# Patient Record
Sex: Female | Born: 1990 | Hispanic: No | Marital: Single | Smoking: Current every day smoker
Health system: Southern US, Community
[De-identification: ages and names within clinical notes are randomized; demographics above are authoritative.]

## PROBLEM LIST (undated history)

## (undated) HISTORY — PX: TONSILLECTOMY: SUR1361

## (undated) HISTORY — PX: SKIN GRAFT: SHX250

---

## 2009-07-27 LAB — HM PAP SMEAR: HM Pap smear: NORMAL

## 2011-08-14 ENCOUNTER — Ambulatory Visit (INDEPENDENT_AMBULATORY_CARE_PROVIDER_SITE_OTHER): Payer: 59 | Admitting: Internal Medicine

## 2011-08-14 ENCOUNTER — Encounter: Payer: Self-pay | Admitting: Internal Medicine

## 2011-08-14 VITALS — BP 130/72 | HR 92 | Temp 98.0°F | Ht 62.0 in | Wt 127.0 lb

## 2011-08-14 DIAGNOSIS — Z720 Tobacco use: Secondary | ICD-10-CM | POA: Insufficient documentation

## 2011-08-14 DIAGNOSIS — F17200 Nicotine dependence, unspecified, uncomplicated: Secondary | ICD-10-CM
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
## 2011-08-14 NOTE — Assessment & Plan Note (Signed)
Encouraged smoking cessation. Discussed potential mechanisms to help her quit smoking including the use of Wellbutrin or Chantix. Also discussed nicotine replacement. The patient is not overweight he quit smoking. We will revisit this at her physical exam in one month.

## 2011-09-11 ENCOUNTER — Other Ambulatory Visit (INDEPENDENT_AMBULATORY_CARE_PROVIDER_SITE_OTHER): Payer: 59 | Admitting: *Deleted

## 2011-09-11 DIAGNOSIS — Z0000 Encounter for general adult medical examination without abnormal findings: Secondary | ICD-10-CM

## 2011-09-11 LAB — CBC WITH DIFFERENTIAL/PLATELET
Basophils Absolute: 0 10*3/uL (ref 0.0–0.1)
Eosinophils Relative: 3.3 % (ref 0.0–5.0)
HCT: 37.9 % (ref 36.0–46.0)
Lymphs Abs: 3.4 10*3/uL (ref 0.7–4.0)
Monocytes Relative: 7.7 % (ref 3.0–12.0)
Neutrophils Relative %: 50.3 % (ref 43.0–77.0)
Platelets: 320 10*3/uL (ref 150.0–400.0)
RDW: 13.7 % (ref 11.5–14.6)
WBC: 8.9 10*3/uL (ref 4.5–10.5)

## 2011-09-11 LAB — COMPREHENSIVE METABOLIC PANEL
ALT: 17 U/L (ref 0–35)
Albumin: 4.2 g/dL (ref 3.5–5.2)
Alkaline Phosphatase: 49 U/L (ref 39–117)
CO2: 27 mEq/L (ref 19–32)
GFR: 141.88 mL/min (ref >60.00)
Potassium: 4.2 mEq/L (ref 3.5–5.1)
Sodium: 141 mEq/L (ref 135–145)
Total Bilirubin: 0.2 mg/dL — ABNORMAL LOW (ref 0.3–1.2)
Total Protein: 6.9 g/dL (ref 6.0–8.3)

## 2011-09-11 LAB — LIPID PANEL
Total CHOL/HDL Ratio: 3
VLDL: 15.4 mg/dL (ref 0.0–40.0)

## 2011-09-25 ENCOUNTER — Encounter: Payer: 59 | Admitting: Internal Medicine

## 2011-09-25 DIAGNOSIS — Z0289 Encounter for other administrative examinations: Secondary | ICD-10-CM

## 2011-10-23 ENCOUNTER — Encounter: Payer: 59 | Admitting: Internal Medicine

## 2011-10-27 ENCOUNTER — Encounter: Payer: 59 | Admitting: Internal Medicine

## 2011-10-29 ENCOUNTER — Other Ambulatory Visit (HOSPITAL_COMMUNITY)
Admission: RE | Admit: 2011-10-29 | Discharge: 2011-10-29 | Disposition: A | Discharge status: Home / Self Care | Payer: 59 | Source: Ambulatory Visit | Attending: Internal Medicine | Admitting: Internal Medicine

## 2011-10-29 ENCOUNTER — Ambulatory Visit (INDEPENDENT_AMBULATORY_CARE_PROVIDER_SITE_OTHER): Payer: 59 | Admitting: Internal Medicine

## 2011-10-29 ENCOUNTER — Encounter: Payer: Self-pay | Admitting: Internal Medicine

## 2011-10-29 VITALS — BP 118/70 | HR 96 | Temp 99.0°F | Ht 62.0 in | Wt 135.0 lb

## 2011-10-29 DIAGNOSIS — Z01419 Encounter for gynecological examination (general) (routine) without abnormal findings: Secondary | ICD-10-CM | POA: Insufficient documentation

## 2011-10-29 DIAGNOSIS — Z1159 Encounter for screening for other viral diseases: Secondary | ICD-10-CM | POA: Insufficient documentation

## 2011-10-29 DIAGNOSIS — Z124 Encounter for screening for malignant neoplasm of cervix: Secondary | ICD-10-CM

## 2011-10-29 NOTE — Progress Notes (Addendum)
Subjective:    Patient ID: Tricia Anderson, female    DOB: 1991/04/19, 21 y.o.   MRN: [id number]  HPI  20YO female presents for annual exam.No concerns today. Has light spotting associated with menses presently. Reports normal energy level, normal appetite.  No outpatient encounter prescriptions on file as of 10/29/2011.     Review of Systems  Constitutional: Negative for fever, chills, appetite change, fatigue and unexpected weight change.  HENT: Negative for ear pain, congestion, sore throat, trouble swallowing, neck pain, voice change and sinus pressure.   Eyes: Negative for visual disturbance.  Respiratory: Negative for cough, shortness of breath, wheezing and stridor.   Cardiovascular: Negative for chest pain, palpitations and leg swelling.  Gastrointestinal: Negative for nausea, vomiting, abdominal pain, diarrhea, constipation, blood in stool, abdominal distention and anal bleeding.  Genitourinary: Positive for vaginal bleeding (start of menses). Negative for dysuria and flank pain.  Musculoskeletal: Negative for myalgias, arthralgias and gait problem.  Skin: Negative for color change and rash.  Neurological: Negative for dizziness and headaches.  Hematological: Negative for adenopathy. Does not bruise/bleed easily.  Psychiatric/Behavioral: Negative for suicidal ideas, sleep disturbance and dysphoric mood. The patient is not nervous/anxious.    BP 118/70  Pulse 96  Temp(Src) 99 F (37.2 C) (Oral)  Ht 5\' 2"  (1.575 m)  Wt 135 lb (61.236 kg)  BMI 24.69 kg/m2  SpO2 99%  LMP 10/29/2011     Objective:   Physical Exam  Constitutional: She is oriented to person, place, and time. She appears well-developed and well-nourished. No distress.  HENT:  Head: Normocephalic and atraumatic.  Right Ear: External ear normal.  Left Ear: External ear normal.  Nose: Nose normal.  Mouth/Throat: Oropharynx is clear and moist. No oropharyngeal exudate.  Eyes: Conjunctivae are normal. Pupils  are equal, round, and reactive to light. Right eye exhibits no discharge. Left eye exhibits no discharge. No scleral icterus.  Neck: Normal range of motion. Neck supple. No tracheal deviation present. No thyromegaly present.  Cardiovascular: Normal rate, regular rhythm, normal heart sounds and intact distal pulses.  Exam reveals no gallop and no friction rub.   No murmur heard. Pulmonary/Chest: Effort normal and breath sounds normal. No respiratory distress. She has no wheezes. She has no rales. She exhibits no tenderness.  Abdominal: Soft. Bowel sounds are normal. She exhibits no distension and no mass. There is no tenderness. There is no rebound and no guarding.  Genitourinary: Rectum normal and uterus normal. No breast swelling, tenderness, discharge or bleeding. Pelvic exam was performed with patient supine. There is no rash, tenderness or lesion on the right labia. There is no rash, tenderness or lesion on the left labia. Uterus is not enlarged and not tender. Cervix exhibits no motion tenderness, no discharge and no friability. Right adnexum displays no mass, no tenderness and no fullness. Left adnexum displays no mass, no tenderness and no fullness. There is bleeding around the vagina. No erythema or tenderness around the vagina. No vaginal discharge found.  Musculoskeletal: Normal range of motion. She exhibits no edema and no tenderness.  Lymphadenopathy:    She has no cervical adenopathy.  Neurological: She is alert and oriented to person, place, and time. No cranial nerve deficit. She exhibits normal muscle tone. Coordination normal.  Skin: Skin is warm and dry. No rash noted. She is not diaphoretic. No erythema. No pallor.  Psychiatric: She has a normal mood and affect. Her behavior is normal. Judgment and thought content normal.  Assessment & Plan:

## 2011-10-29 NOTE — Assessment & Plan Note (Signed)
General exam including pelvic exam is normal today. Pap is pending. Other health maintenance including lab work is up-to-date. Will get records on recent Tdap vaccine. Follow up 1 year and prn.

## 2011-10-29 NOTE — Patient Instructions (Signed)
Please ask your step mother if she knows the date of your last TDAP (tetanus with pertussis vaccine)

## 2011-11-05 ENCOUNTER — Other Ambulatory Visit: Payer: Self-pay | Admitting: *Deleted

## 2011-11-05 MED ORDER — METRONIDAZOLE 250 MG PO TABS: 250.0000 mg | ORAL_TABLET | Freq: Once | ORAL | Status: AC

## 2013-04-17 ENCOUNTER — Emergency Department: Payer: Self-pay | Admitting: Emergency Medicine

## 2018-08-09 ENCOUNTER — Emergency Department
Admission: EM | Admit: 2018-08-09 | Discharge: 2018-08-09 | Disposition: A | Discharge status: Home / Self Care | Payer: 59 | Attending: Emergency Medicine | Admitting: Emergency Medicine

## 2018-08-09 ENCOUNTER — Other Ambulatory Visit: Payer: Self-pay

## 2018-08-09 ENCOUNTER — Encounter: Payer: Self-pay | Admitting: Emergency Medicine

## 2018-08-09 DIAGNOSIS — Y939 Activity, unspecified: Secondary | ICD-10-CM | POA: Insufficient documentation

## 2018-08-09 DIAGNOSIS — Y998 Other external cause status: Secondary | ICD-10-CM | POA: Insufficient documentation

## 2018-08-09 DIAGNOSIS — Y929 Unspecified place or not applicable: Secondary | ICD-10-CM | POA: Insufficient documentation

## 2018-08-09 DIAGNOSIS — S025XXB Fracture of tooth (traumatic), initial encounter for open fracture: Secondary | ICD-10-CM

## 2018-08-09 DIAGNOSIS — S025XXA Fracture of tooth (traumatic), initial encounter for closed fracture: Secondary | ICD-10-CM | POA: Insufficient documentation

## 2018-08-09 DIAGNOSIS — X58XXXA Exposure to other specified factors, initial encounter: Secondary | ICD-10-CM | POA: Insufficient documentation

## 2018-08-09 DIAGNOSIS — F17210 Nicotine dependence, cigarettes, uncomplicated: Secondary | ICD-10-CM | POA: Insufficient documentation

## 2018-08-09 MED ORDER — TRAMADOL HCL 50 MG PO TABS: 50.0000 mg | ORAL_TABLET | Freq: Four times a day (QID) | ORAL | 0 refills | Status: AC | PRN

## 2018-08-09 MED ORDER — IBUPROFEN 800 MG PO TABS: 800.0000 mg | ORAL_TABLET | Freq: Three times a day (TID) | ORAL | 0 refills | Status: DC | PRN

## 2018-08-09 MED ORDER — OXYCODONE-ACETAMINOPHEN 5-325 MG PO TABS
1.0000 | ORAL_TABLET | Freq: Once | ORAL | Status: AC
  Administered 2018-08-09: 1 via ORAL
  Filled 2018-08-09: qty 1

## 2018-08-09 MED ORDER — AMOXICILLIN 500 MG PO CAPS: 500.0000 mg | ORAL_CAPSULE | Freq: Three times a day (TID) | ORAL | 0 refills | Status: DC

## 2018-08-09 MED ORDER — LIDOCAINE VISCOUS HCL 2 % MT SOLN: 10.0000 mL | OROMUCOSAL | 0 refills | Status: DC | PRN

## 2018-08-09 MED ORDER — LIDOCAINE VISCOUS HCL 2 % MT SOLN
15.0000 mL | Freq: Once | OROMUCOSAL | Status: AC
  Administered 2018-08-09: 15 mL via OROMUCOSAL
  Filled 2018-08-09: qty 15

## 2018-08-09 NOTE — ED Triage Notes (Signed)
Pt c/o dental pain, has broken tooth but unable to follow up with dentist. NAD ntoed

## 2018-08-09 NOTE — ED Notes (Signed)
See triage note  Presents with dental pain  States  She thinks she broke a tooth

## 2018-08-09 NOTE — ED Provider Notes (Signed)
[facility] Emergency Department Provider Note  ____________________________________________  Time seen: Approximately 1:34 PM  I have reviewed the triage vital signs and the nursing notes.   HISTORY  Chief Complaint Dental Pain    HPI Tricia Anderson is a 28 y.o. female presents emergency department for evaluation of right top tooth pain for 2 days.  Patient states that her tooth cracked on Sunday.  She had to miss work due to pain.  She states that she was unable to get out of bed due to the pain to come to the emergency department until today.  She has not been able to see a dentist because she does not have the money.  No fever, chills.   History reviewed. No pertinent past medical history.  Patient Active Problem List   Diagnosis Date Noted  . Screening for cervical cancer 10/29/2011  . Tobacco abuse 08/14/2011    Past Surgical History:  Procedure Laterality Date  . SKIN GRAFT     In mouth  . TONSILLECTOMY      Prior to Admission medications   Medication Sig Start Date End Date Taking? Authorizing Provider  amoxicillin (AMOXIL) 500 MG capsule Take 1 capsule (500 mg total) by mouth 3 (three) times daily. 08/09/18   Enid DerryWagner, Zyair Rhein, PA-C  ibuprofen (ADVIL,MOTRIN) 800 MG tablet Take 1 tablet (800 mg total) by mouth every 8 (eight) hours as needed. 08/09/18   Enid DerryWagner, Chonte Ricke, PA-C  lidocaine (XYLOCAINE) 2 % solution Use as directed 10 mLs in the mouth or throat as needed for mouth pain. 08/09/18   Enid DerryWagner, Maxwel Meadowcroft, PA-C  traMADol (ULTRAM) 50 MG tablet Take 1 tablet (50 mg total) by mouth every 6 (six) hours as needed. 08/09/18 08/09/19  Enid DerryWagner, Lithzy Bernard, PA-C    Allergies Patient has no known allergies.  Family History  Problem Relation Age of Onset  . Hypertension Mother   . Colon cancer Father [id number]  . Cancer Maternal Grandfather   . Diabetes Paternal Grandmother   . Cancer Paternal Grandfather   . Breast cancer Neg Hx     Social History Social  History   Tobacco Use  . Smoking status: Current Every Day Smoker    Packs/day: 1.00  . Smokeless tobacco: Never Used  Substance Use Topics  . Alcohol use: No  . Drug use: No     Review of Systems  Constitutional: No fever/chills Cardiovascular: No chest pain. Respiratory:  No SOB. Gastrointestinal: No abdominal pain.  No nausea, no vomiting.  Musculoskeletal: Negative for musculoskeletal pain. Skin: Negative for rash, abrasions, lacerations, ecchymosis. Neurological: Negative for headaches, numbness or tingling   ____________________________________________   PHYSICAL EXAM:  VITAL SIGNS: ED Triage Vitals  Enc Vitals Group     BP 08/09/18 1216 (!) 141/83     Pulse Rate 08/09/18 1216 90     Resp 08/09/18 1216 16     Temp 08/09/18 1216 98.5 F (36.9 C)     Temp Source 08/09/18 1216 Oral     SpO2 08/09/18 1216 100 %     Weight --      Height --      Head Circumference --      Peak Flow --      Pain Score 08/09/18 1209 7     Pain Loc --      Pain Edu? --      Excl. in [place]? --      Constitutional: Alert and oriented. Well appearing and in no acute distress. Eyes:  Conjunctivae are normal. PERRL. EOMI. Head: Atraumatic. ENT:      Ears:      Nose: No congestion/rhinnorhea.      Mouth/Throat: Mucous membranes are moist.  Large fracture and cavity to right back top molar.  No visible swelling.  No difficulty opening or closing mouth.  No drainage. Neck: No stridor.  Cardiovascular: Normal rate, regular rhythm.  Good peripheral circulation. Respiratory: Normal respiratory effort without tachypnea or retractions. Lungs CTAB. Good air entry to the bases with no decreased or absent breath sounds. Musculoskeletal: Full range of motion to all extremities. No gross deformities appreciated. Neurologic:  Normal speech and language. No gross focal neurologic deficits are appreciated.  Skin:  Skin is warm, dry and intact. No rash noted. Psychiatric: Mood and affect are normal.  Speech and behavior are normal. Patient exhibits appropriate insight and judgement.   ____________________________________________   LABS (all labs ordered are listed, but only abnormal results are displayed)  Labs Reviewed - No data to display ____________________________________________  EKG   ____________________________________________  RADIOLOGY  No results found.  ____________________________________________    PROCEDURES  Procedure(s) performed:    Procedures    Medications  oxyCODONE-acetaminophen (PERCOCET/ROXICET) 5-325 MG per tablet 1 tablet (1 tablet Oral Given 08/09/18 1350)  lidocaine (XYLOCAINE) 2 % viscous mouth solution 15 mL (15 mLs Mouth/Throat Given 08/09/18 1350)     ____________________________________________   INITIAL IMPRESSION / ASSESSMENT AND PLAN / ED COURSE  Pertinent labs & imaging results that were available during my care of the patient were reviewed by me and considered in my medical decision making (see chart for details).  Review of the [place] CSRS was performed in accordance of the [facility] prior to dispensing any controlled drugs.     Patient's diagnosis is consistent with cracked tooth. Patient will be discharged home with prescriptions for amoxicillin, tramadol, viscous lidocaine. Patient is to follow up with dentist as directed.  Dental resources were provided.  Patient is given ED precautions to return to the ED for any worsening or new symptoms.     ____________________________________________  FINAL CLINICAL IMPRESSION(S) / ED DIAGNOSES  Final diagnoses:  Open fracture of tooth, initial encounter      NEW MEDICATIONS STARTED DURING THIS VISIT:  ED Discharge Orders         Ordered    amoxicillin (AMOXIL) 500 MG capsule  3 times daily     08/09/18 1339    traMADol (ULTRAM) 50 MG tablet  Every 6 hours PRN     08/09/18 1339    lidocaine (XYLOCAINE) 2 % solution  As needed     08/09/18 1339    ibuprofen  (ADVIL,MOTRIN) 800 MG tablet  Every 8 hours PRN     08/09/18 1339              This chart was dictated using voice recognition software/Dragon. Despite best efforts to proofread, errors can occur which can change the meaning. Any change was purely unintentional.    Enid DerryWagner, Barnett Elzey, PA-C 08/09/18 1412    Minna AntisPaduchowski, Kevin, MD 08/09/18 [phone]

## 2019-11-06 ENCOUNTER — Ambulatory Visit: Payer: Self-pay | Admitting: Obstetrics and Gynecology

## 2019-11-06 NOTE — Progress Notes (Signed)
     Chief Complaint  Patient presents with  . Nexplanon removal    not interested in [facility]    History of Present Illness:  Tricia Anderson is a 29 y.o. that had an IMplanon placed approximately in 2006. Pt lost insurance and hasn't had any until now to have it removed. Has monthly menses, lasting 4-7 days, no BTB, mild dysmen. She is sex actie with female partner. Declines other BC.  No recent pap. No hx of abn paps/STDs. Declines annual today and wants to RTO for annual. ago.  BP 120/80   Ht 5\' 2"  (1.575 m)   Wt 142 lb (64.4 kg)   BMI 25.97 kg/m    Nexplanon removal Procedure note - The Nexplanon was noted in the patient's arm and the end was identified. The skin was cleansed with a Betadine solution. A small injection of subcutaneous lidocaine with epinephrine was given over the end of the implant. An incision was made at the end of the implant. The rod was noted in the incision and grasped with a hemostat. It was noted to be intact.  Steri-Strip was placed approximating the incision. Hemostasis was noted.  Assessment: Nexplanon removal   Plan:   She was told to remove the dressing in 12-24 hours, to keep the incision area dry for 24 hours and to remove the Steristrip in 2-3  days.  Notify 07-11-1992 if any signs of tenderness, redness, pain, or fevers develop.  RTO in 1 mo for annual.   Deborahann Poteat B. Samer Dutton, PA-C 11/07/2019 4:00 PM

## 2019-11-07 ENCOUNTER — Ambulatory Visit (INDEPENDENT_AMBULATORY_CARE_PROVIDER_SITE_OTHER): Payer: Medicaid Other | Admitting: Obstetrics and Gynecology

## 2019-11-07 ENCOUNTER — Encounter: Payer: Self-pay | Admitting: Obstetrics and Gynecology

## 2019-11-07 ENCOUNTER — Other Ambulatory Visit: Payer: Self-pay

## 2019-11-07 VITALS — BP 120/80 | Ht 62.0 in | Wt 142.0 lb

## 2019-11-07 DIAGNOSIS — Z3046 Encounter for surveillance of implantable subdermal contraceptive: Secondary | ICD-10-CM

## 2019-11-07 NOTE — Patient Instructions (Signed)
I value your feedback and entrusting us with your care. If you get a [facility] patient survey, I would appreciate you taking the time to let us know about your experience today. Thank you!  As of July 06, 2019, your lab results will be released to your MyChart immediately, before I even have a chance to see them. Please give me time to review them and contact you if there are any abnormalities. Thank you for your patience.   Remove the dressing in 24 hours,  keep the incision area dry for 24 hours and remove the Steristrip in 2-3  days.  Notify us if any signs of tenderness, redness, pain, or fevers develop.  

## 2021-07-16 DIAGNOSIS — F411 Generalized anxiety disorder: Secondary | ICD-10-CM | POA: Diagnosis not present

## 2021-07-16 DIAGNOSIS — F3341 Major depressive disorder, recurrent, in partial remission: Secondary | ICD-10-CM | POA: Diagnosis not present

## 2021-08-19 DIAGNOSIS — F3341 Major depressive disorder, recurrent, in partial remission: Secondary | ICD-10-CM | POA: Diagnosis not present

## 2021-08-19 DIAGNOSIS — F411 Generalized anxiety disorder: Secondary | ICD-10-CM | POA: Diagnosis not present

## 2021-10-11 ENCOUNTER — Other Ambulatory Visit: Payer: Self-pay

## 2021-10-11 ENCOUNTER — Emergency Department
Admission: EM | Admit: 2021-10-11 | Discharge: 2021-10-11 | Disposition: A | Discharge status: Home / Self Care | Payer: BC Managed Care – PPO | Attending: Emergency Medicine | Admitting: Emergency Medicine

## 2021-10-11 ENCOUNTER — Emergency Department: Payer: BC Managed Care – PPO

## 2021-10-11 DIAGNOSIS — Z23 Encounter for immunization: Secondary | ICD-10-CM | POA: Insufficient documentation

## 2021-10-11 DIAGNOSIS — Y92000 Kitchen of unspecified non-institutional (private) residence as  the place of occurrence of the external cause: Secondary | ICD-10-CM | POA: Diagnosis not present

## 2021-10-11 DIAGNOSIS — Y93G1 Activity, food preparation and clean up: Secondary | ICD-10-CM | POA: Insufficient documentation

## 2021-10-11 DIAGNOSIS — W260XXA Contact with knife, initial encounter: Secondary | ICD-10-CM | POA: Insufficient documentation

## 2021-10-11 DIAGNOSIS — M79642 Pain in left hand: Secondary | ICD-10-CM

## 2021-10-11 DIAGNOSIS — S61412A Laceration without foreign body of left hand, initial encounter: Secondary | ICD-10-CM | POA: Diagnosis not present

## 2021-10-11 DIAGNOSIS — S65202A Unspecified injury of superficial palmar arch of left hand, initial encounter: Secondary | ICD-10-CM | POA: Diagnosis not present

## 2021-10-11 MED ORDER — ACETAMINOPHEN 500 MG PO TABS
1000.0000 mg | ORAL_TABLET | Freq: Once | ORAL | Status: AC
  Administered 2021-10-11: 1000 mg via ORAL
  Filled 2021-10-11: qty 2

## 2021-10-11 MED ORDER — LIDOCAINE-EPINEPHRINE 2 %-1:100000 IJ SOLN
20.0000 mL | Freq: Once | INTRAMUSCULAR | Status: AC
  Administered 2021-10-11: 20 mL

## 2021-10-11 MED ORDER — OXYCODONE HCL 5 MG PO TABS: 5.0000 mg | ORAL_TABLET | Freq: Four times a day (QID) | ORAL | 0 refills | Status: AC | PRN

## 2021-10-11 MED ORDER — CEPHALEXIN 500 MG PO CAPS
500.0000 mg | ORAL_CAPSULE | Freq: Once | ORAL | Status: AC
  Administered 2021-10-11: 500 mg via ORAL
  Filled 2021-10-11: qty 1

## 2021-10-11 MED ORDER — TETANUS-DIPHTH-ACELL PERTUSSIS 5-2.5-18.5 LF-MCG/0.5 IM SUSY
0.5000 mL | PREFILLED_SYRINGE | Freq: Once | INTRAMUSCULAR | Status: AC
Start: 2021-10-11 — End: 2021-10-11
  Administered 2021-10-11: 0.5 mL via INTRAMUSCULAR
  Filled 2021-10-11: qty 0.5

## 2021-10-11 MED ORDER — CEPHALEXIN 500 MG PO CAPS: 500.0000 mg | ORAL_CAPSULE | Freq: Three times a day (TID) | ORAL | 0 refills | Status: AC

## 2021-10-11 MED ORDER — NAPROXEN 500 MG PO TABS
500.0000 mg | ORAL_TABLET | Freq: Once | ORAL | Status: AC
  Administered 2021-10-11: 500 mg via ORAL
  Filled 2021-10-11: qty 1

## 2021-10-11 MED ORDER — BACITRACIN ZINC 500 UNIT/GM EX OINT
TOPICAL_OINTMENT | Freq: Once | CUTANEOUS | Status: AC
  Filled 2021-10-11: qty 0.9

## 2021-10-11 NOTE — ED Notes (Signed)
Wrist splint placed on left wrist with instructions on use. Wound was dressed per orders. ? ?Discharge instructions provided to patient with script info and follow-up info provided. Patient verbalized understanding. Patient ambulated with a steady gait out to the waiting room. ?

## 2021-10-11 NOTE — ED Provider Notes (Signed)
? ?[facility] ?Provider Note ? ? ? Event Date/Time  ? First MD Initiated Contact with Patient 10/11/21 0240   ?  (approximate) ? ? ?History  ? ?Laceration ? ? ?HPI ? ?Tricia Anderson is a 31 y.o. female who presents to the ED for evaluation of Laceration ?  ?Patient presents to the ED for evaluation of an accidental left palmar hand injury that occurred just prior to arrival.  She is right-hand dominant.  She was cutting vegetables with a sharp knife in the kitchen, when something slipped and she cut her own left hand.  Reports 2 lacerations, primarily 1 to the thenar eminence, and another 1 over the fifth metacarpal. ? ?No syncope, head injury or additional trauma.  She is right-hand dominant. ? ?Physical Exam  ? ?Triage Vital Signs: ?ED Triage Vitals  ?Enc Vitals Group  ?   BP 10/11/21 0131 (!) 148/108  ?   Pulse Rate 10/11/21 0131 (!) 132  ?   Resp 10/11/21 0131 16  ?   Temp 10/11/21 0131 98.9 ?F (37.2 ?C)  ?   Temp Source 10/11/21 0131 Oral  ?   SpO2 10/11/21 0131 98 %  ?   Weight 10/11/21 0132 160 lb (72.6 kg)  ?   Height 10/11/21 0132 5\' 2"  (1.575 m)  ?   Head Circumference --   ?   Peak Flow --   ?   Pain Score 10/11/21 0132 4  ?   Pain Loc --   ?   Pain Edu? --   ?   Excl. in [place]? --   ? ? ?Most recent vital signs: ?Vitals:  ? 10/11/21 0131  ?BP: (!) 148/108  ?Pulse: (!) 132  ?Resp: 16  ?Temp: 98.9 ?F (37.2 ?C)  ?SpO2: 98%  ? ? ?General: Awake, no distress.  ?CV:  Good peripheral perfusion.  ?Resp:  Normal effort.  ?Abd:  No distention.  ?MSK:  Transverse laceration to the left hand, bloodsoaked bandage on top of this.  Fairly deep laceration over the thenar eminence into the muscle belly with associated blood clot.  Hemostatic.  She seems to be able to fully range her thumb.  About 6 cm in length ?Smaller more superficial laceration over the fifth metacarpal transversely, hemostatic.  Into the subcutaneous tissue.  About 3 cm in length ?Neuro:  No focal deficits  appreciated. ?Other:   ? ? ? ? ?ED Results / Procedures / Treatments  ? ?Labs ?(all labs ordered are listed, but only abnormal results are displayed) ?Labs Reviewed - No data to display ? ?EKG ? ? ?RADIOLOGY ?Plain film of the left hand reviewed by me without evidence of fracture or foreign body ? ?Official radiology report(s): ?DG Hand Complete Left ? ?Result Date: 10/11/2021 ?CLINICAL DATA:  Palmar laceration EXAM: LEFT HAND - COMPLETE 3+ VIEW COMPARISON:  None. FINDINGS: There is no evidence of fracture or dislocation. There is no evidence of arthropathy or other focal bone abnormality. Multiple soft tissue lacerations noted medial to the first metacarpal and lateral to the fifth metacarpal. No retained radiopaque foreign body. IMPRESSION: No retained radiopaque foreign body.  No fracture or dislocation. Electronically Signed   By: Fidela Salisbury M.D.   On: 10/11/2021 03:26   ? ?PROCEDURES and INTERVENTIONS: ? ?Marland Kitchen.Laceration Repair ? ?Date/Time: 10/11/2021 4:43 AM ?Performed by: Vladimir Crofts, MD ?Authorized by: Vladimir Crofts, MD  ? ?Consent:  ?  Consent obtained:  Verbal ?  Consent given by:  Patient ?  Risks, benefits, and alternatives were discussed: yes   ?Anesthesia:  ?  Anesthesia method:  Local infiltration ?  Local anesthetic:  Lidocaine 2% WITH epi ?Laceration details:  ?  Location:  Hand ?  Hand location:  L palm ?  Length (cm):  6 ?Exploration:  ?  Hemostasis achieved with:  Direct pressure ?  Imaging obtained: x-ray   ?  Imaging outcome: foreign body not noted   ?Treatment:  ?  Area cleansed with:  Povidone-iodine ?  Amount of cleaning:  Extensive ?  Irrigation solution:  Sterile water ?  Layers/structures repaired:  Muscle belly ?Muscle belly:  ?  Suture size:  4-0 ?  Suture material:  Monocryl ?  Suture technique:  Buried horizontal mattress ?  Number of sutures:  2 ?Skin repair:  ?  Repair method:  Sutures ?  Suture size:  3-0 ?  Suture material:  Nylon ?  Suture technique:  Simple interrupted and  horizontal mattress ?  Number of sutures:  9 ?Approximation:  ?  Approximation:  Close ?Repair type:  ?  Repair type:  Intermediate ?Post-procedure details:  ?  Dressing:  Antibiotic ointment, non-adherent dressing and splint for protection ?  Procedure completion:  Tolerated well, no immediate complications ? ?Medications  ?bacitracin ointment (has no administration in time range)  ?lidocaine-EPINEPHrine (XYLOCAINE W/EPI) 2 %-1:100000 (with pres) injection 20 mL (20 mLs Infiltration Given 10/11/21 0251)  ?acetaminophen (TYLENOL) tablet 1,000 mg (1,000 mg Oral Given 10/11/21 0402)  ?naproxen (NAPROSYN) tablet 500 mg (500 mg Oral Given 10/11/21 0402)  ?Tdap (BOOSTRIX) injection 0.5 mL (0.5 mLs Intramuscular Given 10/11/21 0401)  ?cephALEXin (KEFLEX) capsule 500 mg (500 mg Oral Given 10/11/21 0402)  ? ? ? ?IMPRESSION / MDM / ASSESSMENT AND PLAN / ED COURSE  ?I reviewed the triage vital signs and the nursing notes. ? ?Healthy 31 year old female presents to the ED with accidental self-inflicted left hand laceration requiring bedside repair and hand surgical follow-up.  Isolated injury to the palmar left hand, primarily to the thenar eminence.  Into the muscle belly with a large clot overlying this, but hemostatic.  X-ray without foreign body or underlying fracture.  She seems to be able to fully range her thumb, especially after I repaired her muscle belly.  Multilayered repair, as above.  2 deep Monocryl sutures to repair the muscle belly, 1 horizontal mattress with simple interrupted's to repair after this.  We will start her on Keflex to prevent infection considering the deep nature of her wound.  Discussed wound care at home and provided follow-up with hand surgery.  Return precautions discussed. ? ?Clinical Course as of 10/11/21 0438  ?Sat Oct 11, 2021  ?0258 Applied anesthesia and left her soaking in a Betadine saline solution [DS]  ?[id number] Lac repair done ?12 total [DS]  ?  ?Clinical Course User Index ?[DS] Vladimir Crofts, MD  ? ? ? ?FINAL CLINICAL IMPRESSION(S) / ED DIAGNOSES  ? ?Final diagnoses:  ?Laceration of left hand without foreign body, initial encounter  ?Left hand pain  ? ? ? ?Rx / DC Orders  ? ?ED Discharge Orders   ? ?      Ordered  ?  oxyCODONE (ROXICODONE) 5 MG immediate release tablet  Every 6 hours PRN       ? 10/11/21 0433  ?  cephALEXin (KEFLEX) 500 MG capsule  3 times daily       ? 10/11/21 0433  ? ?  ?  ? ?  ? ? ? ?  Note:  This document was prepared using Dragon voice recognition software and may include unintentional dictation errors. ?  ?Vladimir Crofts, MD ?10/11/21 [phone] ? ?

## 2021-10-11 NOTE — ED Triage Notes (Signed)
Pt with etoh ingestion, pt states lacerated left hand on knife while cutting vegetables approx 30 min pta. Pt with approx 4cm linear laceration at base of left thumb with controlled bleeding.  ?

## 2021-10-11 NOTE — ED Notes (Signed)
Laceration is cleaned and banged  ?

## 2021-10-11 NOTE — Discharge Instructions (Addendum)
Reach out to Dr. Stephenie Acres, the local hand surgeon, to be seen in the next 1 week or so for a re-check and to evaluate the need for delayed surgery for tendon repair.  ? ?We placed a total of 12 stitches.  2 deep ones that will absorb on their own, and 10 on top.  These will need to be removed in about 7-10 days. ? ?Please take Tylenol and ibuprofen/Advil for your pain.  It is safe to take them together, or to alternate them every few hours.  Take up to 1000mg  of Tylenol at a time, up to 4 times per day.  Do not take more than 4000 mg of Tylenol in 24 hours.  For ibuprofen, take 400-600 mg, 4-5 times per day. ? ?Use oxycodone as needed for more severe/breakthrough pain. ? ?Take Keflex 3 times daily to prevent infection due to the fairly deep nature of the wound. ? ?In general, keep the area clean and dry. Then apply a Bacitracin/Neosporin type of antibiotic ointment.  ?Gently wash with warm soap and water once daily, and again if it gets dirty. Don't vigorously scrub at the wound.  ?Gently pat dry. ?Once dry, apply Neosporin / bacitracin type antibiotic ointment. It is important to keep the wound moist with an antibiotic ointment, or a small amount of Vaseline, for the first 5 days. Keeping it moist/clean will help the area heal faster and prevent scar tissue formation.  ?If you are doing anything active, keep it covered.  If you are sitting around at home, you may leave it uncovered. ? ?Keep your left hand and the wrist brace to remind yourself not to use it too much.  Sleep with the brace on. ?

## 2021-10-21 DIAGNOSIS — S61412A Laceration without foreign body of left hand, initial encounter: Secondary | ICD-10-CM | POA: Diagnosis not present

## 2021-10-22 DIAGNOSIS — F3341 Major depressive disorder, recurrent, in partial remission: Secondary | ICD-10-CM | POA: Diagnosis not present

## 2021-10-22 DIAGNOSIS — F411 Generalized anxiety disorder: Secondary | ICD-10-CM | POA: Diagnosis not present

## 2021-11-18 DIAGNOSIS — S61412A Laceration without foreign body of left hand, initial encounter: Secondary | ICD-10-CM | POA: Diagnosis not present

## 2021-12-09 DIAGNOSIS — F1010 Alcohol abuse, uncomplicated: Secondary | ICD-10-CM | POA: Diagnosis not present

## 2021-12-09 DIAGNOSIS — R454 Irritability and anger: Secondary | ICD-10-CM | POA: Diagnosis not present

## 2021-12-09 DIAGNOSIS — F4312 Post-traumatic stress disorder, chronic: Secondary | ICD-10-CM | POA: Diagnosis not present

## 2021-12-23 DIAGNOSIS — R454 Irritability and anger: Secondary | ICD-10-CM | POA: Diagnosis not present

## 2021-12-23 DIAGNOSIS — F4312 Post-traumatic stress disorder, chronic: Secondary | ICD-10-CM | POA: Diagnosis not present

## 2021-12-23 DIAGNOSIS — F1010 Alcohol abuse, uncomplicated: Secondary | ICD-10-CM | POA: Diagnosis not present

## 2022-01-05 DIAGNOSIS — R454 Irritability and anger: Secondary | ICD-10-CM | POA: Diagnosis not present

## 2022-01-05 DIAGNOSIS — F4312 Post-traumatic stress disorder, chronic: Secondary | ICD-10-CM | POA: Diagnosis not present

## 2022-01-05 DIAGNOSIS — F1010 Alcohol abuse, uncomplicated: Secondary | ICD-10-CM | POA: Diagnosis not present

## 2022-02-02 DIAGNOSIS — F1010 Alcohol abuse, uncomplicated: Secondary | ICD-10-CM | POA: Diagnosis not present

## 2022-02-02 DIAGNOSIS — R454 Irritability and anger: Secondary | ICD-10-CM | POA: Diagnosis not present

## 2022-02-02 DIAGNOSIS — F4312 Post-traumatic stress disorder, chronic: Secondary | ICD-10-CM | POA: Diagnosis not present

## 2022-03-02 DIAGNOSIS — R454 Irritability and anger: Secondary | ICD-10-CM | POA: Diagnosis not present

## 2022-03-02 DIAGNOSIS — F1010 Alcohol abuse, uncomplicated: Secondary | ICD-10-CM | POA: Diagnosis not present

## 2022-03-02 DIAGNOSIS — F4312 Post-traumatic stress disorder, chronic: Secondary | ICD-10-CM | POA: Diagnosis not present

## 2022-03-03 DIAGNOSIS — F1010 Alcohol abuse, uncomplicated: Secondary | ICD-10-CM | POA: Diagnosis not present

## 2022-03-03 DIAGNOSIS — F4312 Post-traumatic stress disorder, chronic: Secondary | ICD-10-CM | POA: Diagnosis not present

## 2022-03-31 DIAGNOSIS — R454 Irritability and anger: Secondary | ICD-10-CM | POA: Diagnosis not present

## 2022-03-31 DIAGNOSIS — F1010 Alcohol abuse, uncomplicated: Secondary | ICD-10-CM | POA: Diagnosis not present

## 2022-03-31 DIAGNOSIS — F4312 Post-traumatic stress disorder, chronic: Secondary | ICD-10-CM | POA: Diagnosis not present

## 2022-06-02 DIAGNOSIS — F4312 Post-traumatic stress disorder, chronic: Secondary | ICD-10-CM | POA: Diagnosis not present

## 2022-06-02 DIAGNOSIS — F1010 Alcohol abuse, uncomplicated: Secondary | ICD-10-CM | POA: Diagnosis not present

## 2022-06-30 DIAGNOSIS — F4312 Post-traumatic stress disorder, chronic: Secondary | ICD-10-CM | POA: Diagnosis not present

## 2022-06-30 DIAGNOSIS — F1010 Alcohol abuse, uncomplicated: Secondary | ICD-10-CM | POA: Diagnosis not present

## 2022-09-16 DIAGNOSIS — R454 Irritability and anger: Secondary | ICD-10-CM | POA: Diagnosis not present

## 2022-09-16 DIAGNOSIS — F1010 Alcohol abuse, uncomplicated: Secondary | ICD-10-CM | POA: Diagnosis not present

## 2022-09-16 DIAGNOSIS — F4312 Post-traumatic stress disorder, chronic: Secondary | ICD-10-CM | POA: Diagnosis not present

## 2022-10-26 DIAGNOSIS — Z13220 Encounter for screening for lipoid disorders: Secondary | ICD-10-CM | POA: Diagnosis not present

## 2022-10-26 DIAGNOSIS — G2581 Restless legs syndrome: Secondary | ICD-10-CM | POA: Diagnosis not present

## 2022-10-26 DIAGNOSIS — Z0000 Encounter for general adult medical examination without abnormal findings: Secondary | ICD-10-CM | POA: Diagnosis not present

## 2023-02-06 IMAGING — DX DG HAND COMPLETE 3+V*L*
3 series · 3 of 3 positions shown · non-contrast
Comparison: None.

CLINICAL DATA: Palmar laceration

EXAM:
LEFT HAND - COMPLETE 3+ VIEW

[hand ap]
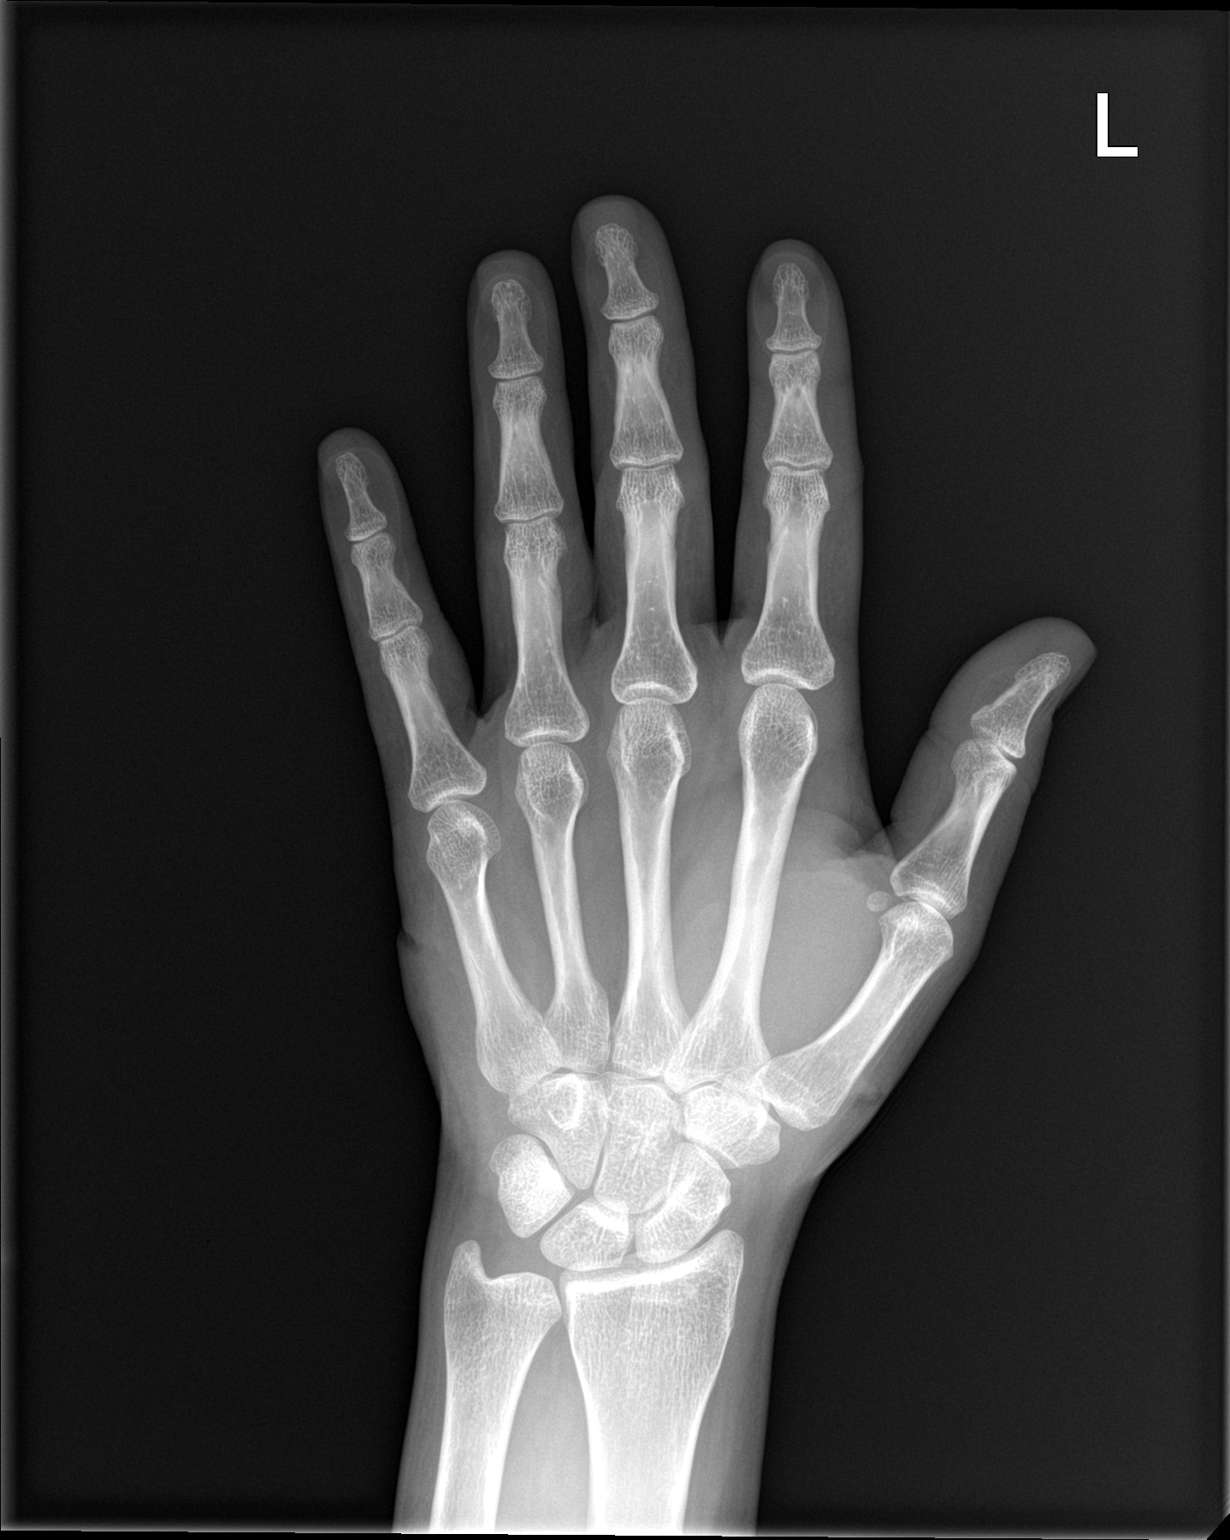

[hand obl]
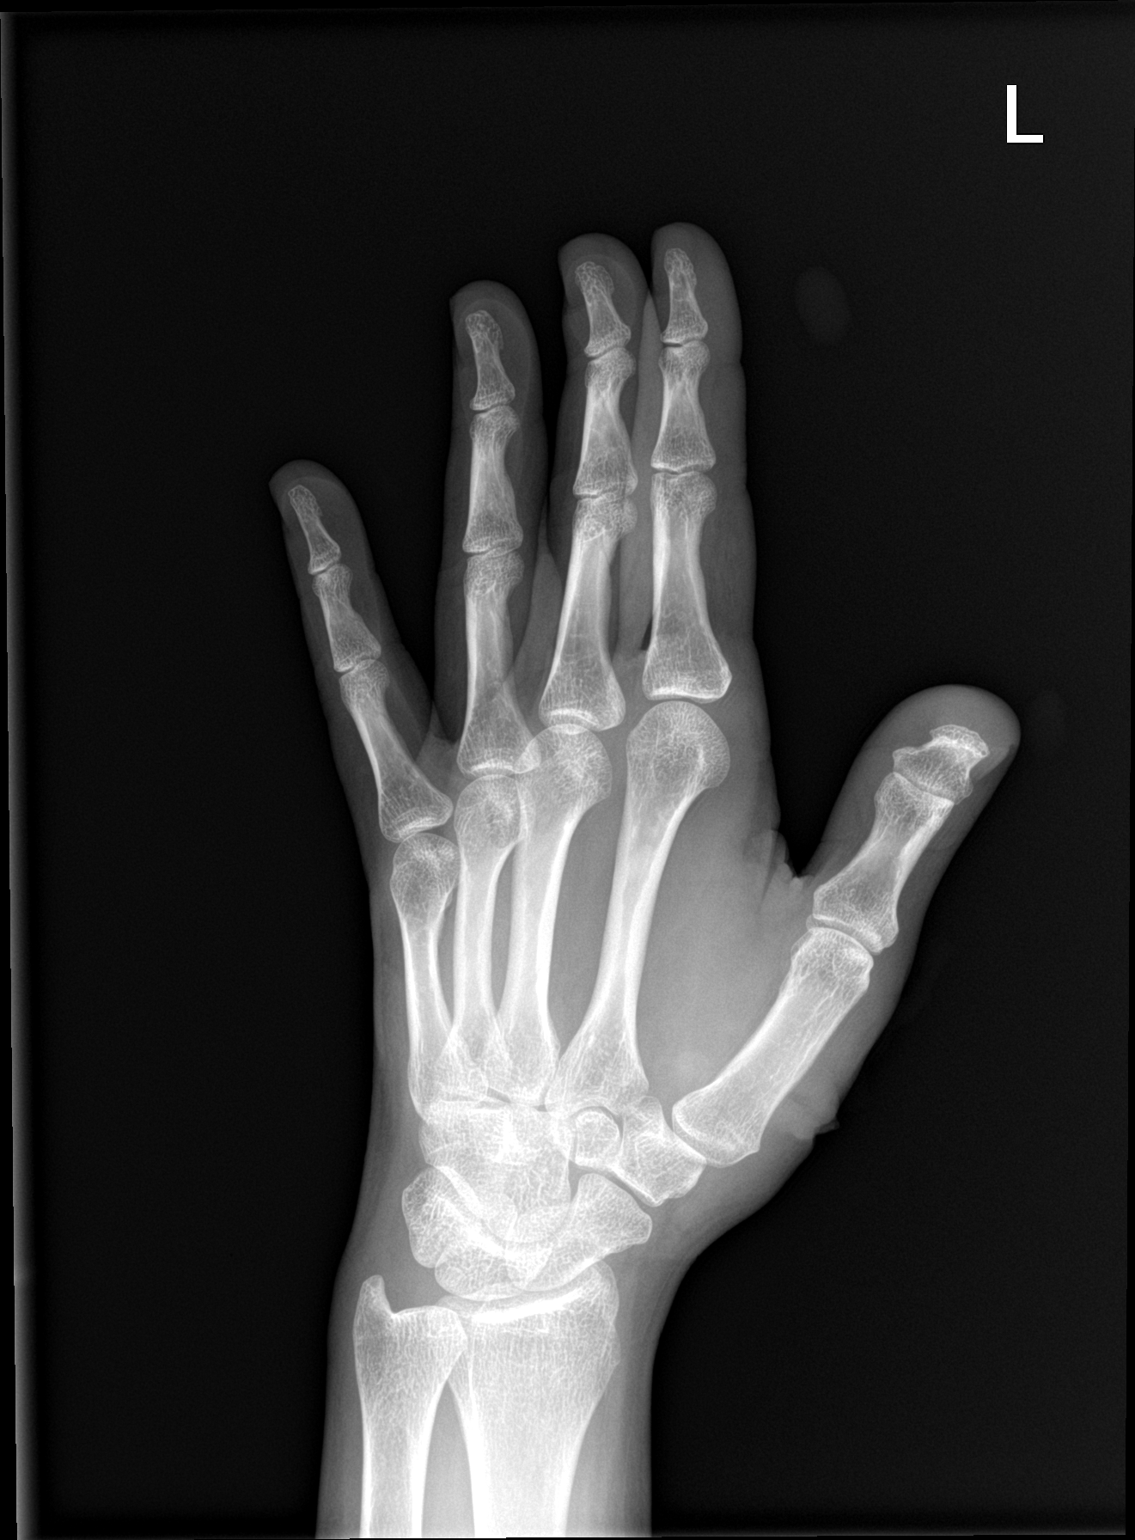

[hand lat]
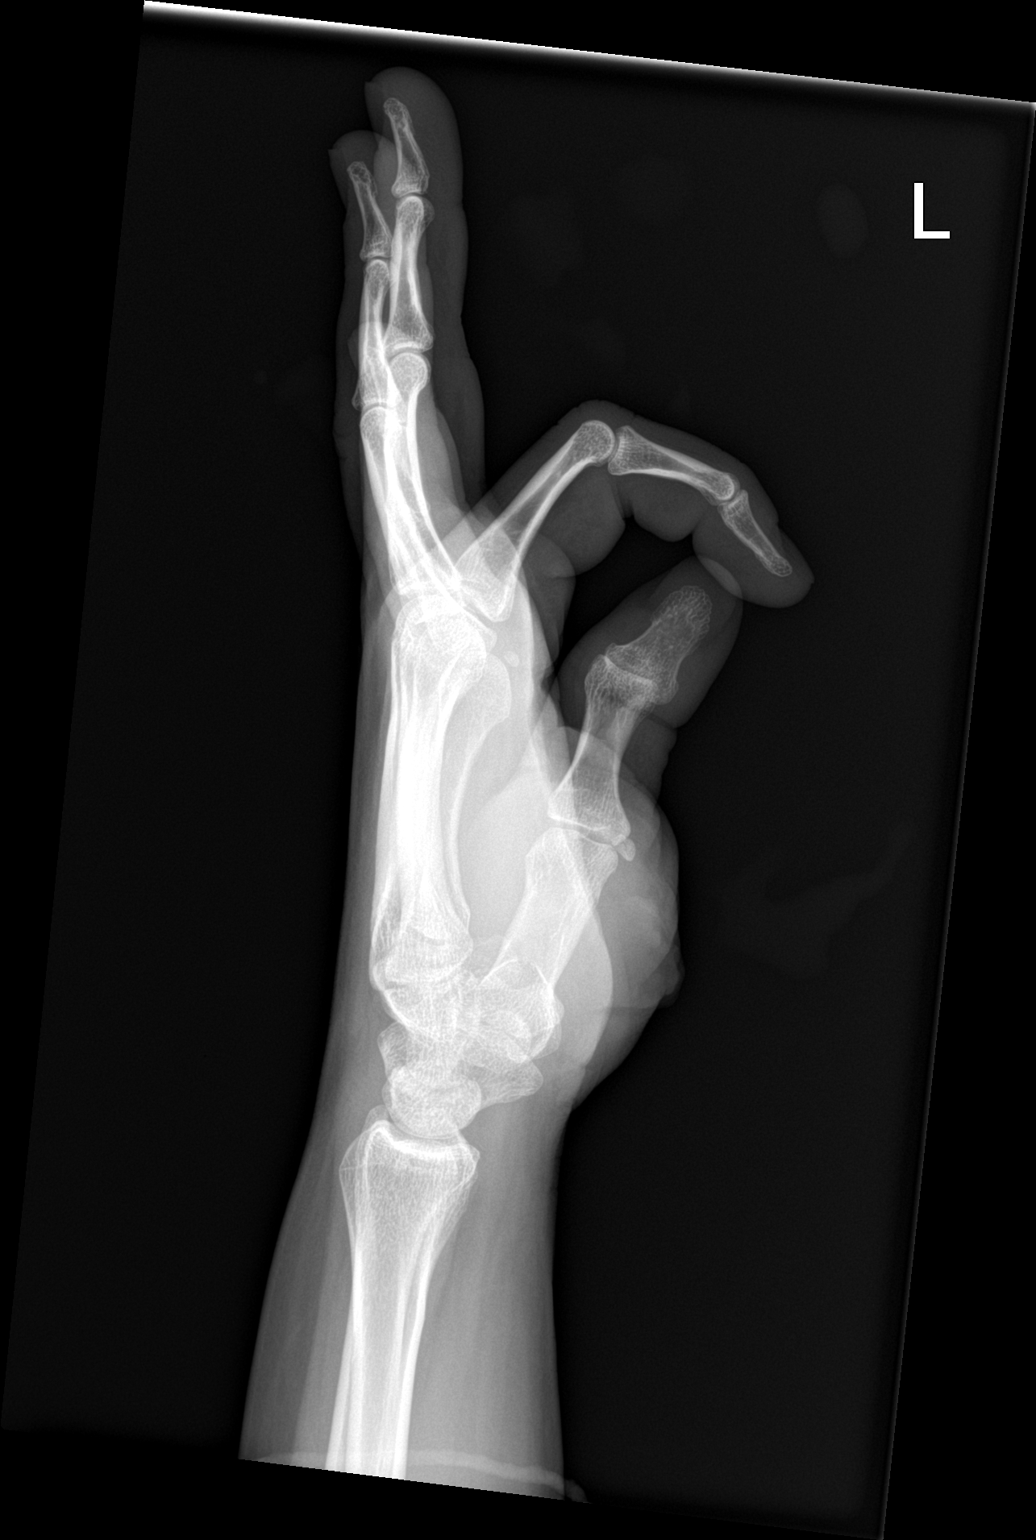

[3 of 3 positions shown; findings below may reference images not displayed]

FINDINGS: There is no evidence of fracture or dislocation. There is no
evidence of arthropathy or other focal bone abnormality. Multiple
soft tissue lacerations noted medial to the first metacarpal and
lateral to the fifth metacarpal. No retained radiopaque foreign
body.
IMPRESSION: No retained radiopaque foreign body.  No fracture or dislocation.

## 2023-03-02 DIAGNOSIS — M79642 Pain in left hand: Secondary | ICD-10-CM | POA: Diagnosis not present

## 2023-05-03 DIAGNOSIS — F1010 Alcohol abuse, uncomplicated: Secondary | ICD-10-CM | POA: Diagnosis not present

## 2023-05-03 DIAGNOSIS — R454 Irritability and anger: Secondary | ICD-10-CM | POA: Diagnosis not present

## 2023-05-03 DIAGNOSIS — F4312 Post-traumatic stress disorder, chronic: Secondary | ICD-10-CM | POA: Diagnosis not present

## 2024-01-24 DIAGNOSIS — Z01419 Encounter for gynecological examination (general) (routine) without abnormal findings: Secondary | ICD-10-CM | POA: Diagnosis not present

## 2024-02-01 DIAGNOSIS — Z30017 Encounter for initial prescription of implantable subdermal contraceptive: Secondary | ICD-10-CM | POA: Diagnosis not present

## 2024-02-01 DIAGNOSIS — Z3202 Encounter for pregnancy test, result negative: Secondary | ICD-10-CM | POA: Diagnosis not present

## 2024-02-10 DIAGNOSIS — G2581 Restless legs syndrome: Secondary | ICD-10-CM | POA: Diagnosis not present
